# Patient Record
Sex: Female | Born: 1970 | Race: Black or African American | Hispanic: No | Marital: Married | Smoking: Never smoker
Health system: Southern US, Community
[De-identification: ages and names within clinical notes are randomized; demographics above are authoritative.]

## PROBLEM LIST (undated history)

## (undated) DIAGNOSIS — Z789 Other specified health status: Secondary | ICD-10-CM

## (undated) HISTORY — DX: Other specified health status: Z78.9

---

## 2005-07-15 ENCOUNTER — Ambulatory Visit: Payer: Self-pay | Admitting: Internal Medicine

## 2007-07-20 ENCOUNTER — Ambulatory Visit: Payer: Self-pay

## 2007-08-07 ENCOUNTER — Ambulatory Visit: Payer: Self-pay

## 2007-11-01 ENCOUNTER — Ambulatory Visit: Payer: Self-pay | Admitting: Internal Medicine

## 2008-01-22 ENCOUNTER — Ambulatory Visit: Payer: Self-pay | Admitting: Physician Assistant

## 2008-07-25 HISTORY — PX: FOOT SURGERY: SHX648

## 2008-09-17 ENCOUNTER — Ambulatory Visit: Payer: Self-pay | Admitting: Podiatry

## 2008-09-19 ENCOUNTER — Ambulatory Visit: Payer: Self-pay | Admitting: Podiatry

## 2008-11-06 ENCOUNTER — Ambulatory Visit: Payer: Self-pay | Admitting: Podiatry

## 2008-11-07 ENCOUNTER — Ambulatory Visit: Payer: Self-pay | Admitting: Podiatry

## 2009-07-25 HISTORY — PX: OVARIAN CYST SURGERY: SHX726

## 2009-07-25 HISTORY — PX: SALPINGECTOMY: SHX328

## 2009-07-25 HISTORY — PX: LAPAROSCOPY: SHX197

## 2010-07-25 NOTE — L&D Delivery Note (Signed)
Delivery Note Pt reached complete dilation and pushed well about 1 1/2 hours.  At 6:51 PM a healthy female was delivered via Vaginal, Spontaneous Delivery (Presentation: Left Occiput Anterior).  APGAR: 8, 9; weight 9 lb 2.9 oz (4165 g).   Placenta status: Intact, Spontaneous. Anesthesia: Epidural There was a moderate shoulder dystocia with the right arm anterior relieved with MacRoberts, posterior axillary lift, and suprapubic pressure. Pt with large fibroid uterus, but contracted down well after delivery. Episiotomy: None Lacerations: 1st degree;Perineal;Labial Suture Repair: 3.0 vicryl rapide Est. Blood Loss (mL): 450  Mom to postpartum.  Baby to nursery-stable.  Oliver Pila 03/25/2011, 7:28 PM

## 2010-09-06 LAB — GC/CHLAMYDIA PROBE AMP, GENITAL: Gonorrhea: NEGATIVE

## 2010-09-06 LAB — ABO/RH

## 2010-09-06 LAB — RPR: RPR: NONREACTIVE

## 2010-09-06 LAB — RUBELLA ANTIBODY, IGM: Rubella: IMMUNE

## 2010-12-24 ENCOUNTER — Inpatient Hospital Stay (HOSPITAL_COMMUNITY): Admission: AD | Admit: 2010-12-24 | Payer: Self-pay | Source: Ambulatory Visit | Admitting: Obstetrics and Gynecology

## 2011-02-24 LAB — STREP B DNA PROBE: GBS: NEGATIVE

## 2011-03-22 ENCOUNTER — Encounter (HOSPITAL_COMMUNITY): Payer: Self-pay | Admitting: *Deleted

## 2011-03-22 ENCOUNTER — Telehealth (HOSPITAL_COMMUNITY): Payer: Self-pay | Admitting: *Deleted

## 2011-03-22 NOTE — Telephone Encounter (Signed)
Preadmission screen  

## 2011-03-23 ENCOUNTER — Encounter (HOSPITAL_COMMUNITY): Payer: Self-pay | Admitting: *Deleted

## 2011-03-24 ENCOUNTER — Other Ambulatory Visit: Payer: Self-pay | Admitting: Obstetrics and Gynecology

## 2011-03-24 ENCOUNTER — Inpatient Hospital Stay (HOSPITAL_COMMUNITY)
Admission: RE | Admit: 2011-03-24 | Discharge: 2011-03-27 | DRG: 373 | Disposition: A | Discharge status: Home / Self Care | Payer: BC Managed Care – PPO | Source: Ambulatory Visit | Attending: Obstetrics and Gynecology | Admitting: Obstetrics and Gynecology

## 2011-03-24 ENCOUNTER — Encounter (HOSPITAL_COMMUNITY): Payer: Self-pay

## 2011-03-24 DIAGNOSIS — O34599 Maternal care for other abnormalities of gravid uterus, unspecified trimester: Secondary | ICD-10-CM | POA: Diagnosis present

## 2011-03-24 DIAGNOSIS — D4959 Neoplasm of unspecified behavior of other genitourinary organ: Secondary | ICD-10-CM | POA: Diagnosis present

## 2011-03-24 DIAGNOSIS — O480 Post-term pregnancy: Principal | ICD-10-CM | POA: Diagnosis present

## 2011-03-24 DIAGNOSIS — D259 Leiomyoma of uterus, unspecified: Secondary | ICD-10-CM | POA: Diagnosis present

## 2011-03-24 LAB — CBC
Hemoglobin: 12.7 g/dL (ref 12.0–15.0)
MCH: 28.6 pg (ref 26.0–34.0)
MCV: 81.3 fL (ref 78.0–100.0)
Platelets: 154 10*3/uL (ref 150–400)
RBC: 4.44 MIL/uL (ref 3.87–5.11)
WBC: 6.8 10*3/uL (ref 4.0–10.5)

## 2011-03-24 MED ORDER — TERBUTALINE SULFATE 1 MG/ML IJ SOLN: 0.2500 mg | Freq: Once | INTRAMUSCULAR | Status: AC | PRN

## 2011-03-24 MED ORDER — ONDANSETRON HCL 4 MG/2ML IJ SOLN
4.0000 mg | Freq: Four times a day (QID) | INTRAMUSCULAR | Status: DC | PRN
  Administered 2011-03-25: 4 mg via INTRAVENOUS
  Filled 2011-03-24: qty 2

## 2011-03-24 MED ORDER — LACTATED RINGERS IV SOLN: 500.0000 mL | INTRAVENOUS | Status: DC | PRN

## 2011-03-24 MED ORDER — DINOPROSTONE 10 MG VA INST
10.0000 mg | VAGINAL_INSERT | Freq: Once | VAGINAL | Status: AC
  Administered 2011-03-24: 10 mg via VAGINAL
  Filled 2011-03-24: qty 1

## 2011-03-24 MED ORDER — OXYTOCIN 20 UNITS IN LACTATED RINGERS INFUSION - SIMPLE: 125.0000 mL/h | Freq: Once | INTRAVENOUS | Status: DC

## 2011-03-24 MED ORDER — LACTATED RINGERS IV SOLN
INTRAVENOUS | Status: DC
  Administered 2011-03-24 – 2011-03-25 (×3): via INTRAVENOUS
  Administered 2011-03-25: 500 mL via INTRAVENOUS
  Administered 2011-03-25: 17:00:00 via INTRAVENOUS

## 2011-03-24 MED ORDER — IBUPROFEN 600 MG PO TABS: 600.0000 mg | ORAL_TABLET | Freq: Four times a day (QID) | ORAL | Status: DC | PRN

## 2011-03-24 MED ORDER — ACETAMINOPHEN 325 MG PO TABS: 650.0000 mg | ORAL_TABLET | ORAL | Status: DC | PRN

## 2011-03-24 MED ORDER — CITRIC ACID-SODIUM CITRATE 334-500 MG/5ML PO SOLN: 30.0000 mL | ORAL | Status: DC | PRN

## 2011-03-24 MED ORDER — LIDOCAINE HCL (PF) 1 % IJ SOLN
30.0000 mL | INTRAMUSCULAR | Status: DC | PRN
  Administered 2011-03-25: 30 mL via SUBCUTANEOUS
  Filled 2011-03-24: qty 30

## 2011-03-24 MED ORDER — OXYCODONE-ACETAMINOPHEN 5-325 MG PO TABS: 2.0000 | ORAL_TABLET | ORAL | Status: DC | PRN

## 2011-03-24 MED ORDER — OXYTOCIN BOLUS FROM INFUSION
500.0000 mL | Freq: Once | INTRAVENOUS | Status: DC
  Filled 2011-03-24: qty 500
  Filled 2011-03-24: qty 1000

## 2011-03-24 MED ORDER — OXYTOCIN 20 UNITS IN LACTATED RINGERS INFUSION - SIMPLE
1.0000 m[IU]/min | INTRAVENOUS | Status: DC
  Administered 2011-03-25: 10 m[IU]/min via INTRAVENOUS
  Administered 2011-03-25: 333 m[IU]/min via INTRAVENOUS
  Administered 2011-03-25: 2 m[IU]/min via INTRAVENOUS

## 2011-03-24 MED ORDER — FLEET ENEMA 7-19 GM/118ML RE ENEM: 1.0000 | ENEMA | RECTAL | Status: DC | PRN

## 2011-03-24 NOTE — H&P (Signed)
Ann Salas is an 40 y.o. female.G1P0 at 41+ weeks (EDD 03/16/11 by LMP c/w 12 week [place]) presents for induction of labor given postdates.  Prenatal care complicated by large fibroid uterus, 11cm and 8cm.  H/o borderline CHTN, but BP stable this pregnancy.  Pertinent Gynecological History: Fibroid Uterus  OB History: G1P0   Menstrual History: Patient's last menstrual period was 06/09/2010.    PMHx Borderline HTN  Past Surgical History  Procedure Date  . Foot surgery 2010  . Laparoscopy 2011  . Ovarian cyst surgery 2011  . Salpingectomy 2011    Family History  Problem Relation Age of Onset  . Hypertension Mother   . Diabetes Mother   . Hypertension Father   . Diabetes Maternal Grandmother     Social History:  reports that she has never smoked. She does not have any smokeless tobacco history on file. She reports that she does not drink alcohol or use illicit drugs.  Allergies: No Known Allergies    ROS  Last menstrual period 06/09/2010. Physical Exam  Constitutional: She appears well-developed.  Cardiovascular: Normal rate and regular rhythm.   Respiratory: Effort normal and breath sounds normal.  GI: Soft. Bowel sounds are normal.  Genitourinary: Vagina normal.       Cervix 50/1/-2   Prenatals Opos, Ab neg, Rub I, HepBsAg neg, HIV neg, RPR NR, GC neg, Chlam neg, GBS neg, First trimester screen WNL, AFP WNL, Sickle AA, One hour glucola 132    Assessment/Plan: Pt is coming in for a ripening and induction of labor.  Plan cervidil and then pitocin.  Oliver Pila 03/24/2011, 6:05 PM

## 2011-03-25 ENCOUNTER — Inpatient Hospital Stay (HOSPITAL_COMMUNITY): Payer: BC Managed Care – PPO | Admitting: Anesthesiology

## 2011-03-25 ENCOUNTER — Encounter (HOSPITAL_COMMUNITY): Payer: Self-pay | Admitting: Anesthesiology

## 2011-03-25 ENCOUNTER — Encounter (HOSPITAL_COMMUNITY): Payer: Self-pay

## 2011-03-25 MED ORDER — PRENATAL PLUS 27-1 MG PO TABS
1.0000 | ORAL_TABLET | Freq: Every day | ORAL | Status: DC
  Administered 2011-03-26 – 2011-03-27 (×2): 1 via ORAL
  Filled 2011-03-25 (×2): qty 1

## 2011-03-25 MED ORDER — LACTATED RINGERS IV SOLN: 500.0000 mL | Freq: Once | INTRAVENOUS | Status: DC

## 2011-03-25 MED ORDER — SIMETHICONE 80 MG PO CHEW: 80.0000 mg | CHEWABLE_TABLET | ORAL | Status: DC | PRN

## 2011-03-25 MED ORDER — LIDOCAINE HCL 1.5 % IJ SOLN
INTRAMUSCULAR | Status: DC | PRN
  Administered 2011-03-25 (×2): 5 mL via EPIDURAL

## 2011-03-25 MED ORDER — SENNOSIDES-DOCUSATE SODIUM 8.6-50 MG PO TABS
2.0000 | ORAL_TABLET | Freq: Every day | ORAL | Status: DC
  Administered 2011-03-25 – 2011-03-26 (×2): 2 via ORAL

## 2011-03-25 MED ORDER — DIPHENHYDRAMINE HCL 50 MG/ML IJ SOLN: 12.5000 mg | INTRAMUSCULAR | Status: DC | PRN

## 2011-03-25 MED ORDER — PHENYLEPHRINE 40 MCG/ML (10ML) SYRINGE FOR IV PUSH (FOR BLOOD PRESSURE SUPPORT)
80.0000 ug | PREFILLED_SYRINGE | INTRAVENOUS | Status: DC | PRN
  Filled 2011-03-25 (×2): qty 5

## 2011-03-25 MED ORDER — OXYCODONE-ACETAMINOPHEN 5-325 MG PO TABS: 1.0000 | ORAL_TABLET | ORAL | Status: DC | PRN

## 2011-03-25 MED ORDER — DIBUCAINE 1 % RE OINT: 1.0000 "application " | TOPICAL_OINTMENT | RECTAL | Status: DC | PRN

## 2011-03-25 MED ORDER — LANOLIN HYDROUS EX OINT: TOPICAL_OINTMENT | CUTANEOUS | Status: DC | PRN

## 2011-03-25 MED ORDER — ZOLPIDEM TARTRATE 5 MG PO TABS: 5.0000 mg | ORAL_TABLET | Freq: Every evening | ORAL | Status: DC | PRN

## 2011-03-25 MED ORDER — DIPHENHYDRAMINE HCL 25 MG PO CAPS: 25.0000 mg | ORAL_CAPSULE | Freq: Four times a day (QID) | ORAL | Status: DC | PRN

## 2011-03-25 MED ORDER — WITCH HAZEL-GLYCERIN EX PADS
1.0000 "application " | MEDICATED_PAD | CUTANEOUS | Status: DC | PRN
  Administered 2011-03-25: 1 via TOPICAL

## 2011-03-25 MED ORDER — TETANUS-DIPHTH-ACELL PERTUSSIS 5-2.5-18.5 LF-MCG/0.5 IM SUSP
0.5000 mL | Freq: Once | INTRAMUSCULAR | Status: AC
  Administered 2011-03-26: 0.5 mL via INTRAMUSCULAR
  Filled 2011-03-25: qty 0.5

## 2011-03-25 MED ORDER — BENZOCAINE-MENTHOL 20-0.5 % EX AERO: 1.0000 "application " | INHALATION_SPRAY | CUTANEOUS | Status: DC | PRN

## 2011-03-25 MED ORDER — PHENYLEPHRINE 40 MCG/ML (10ML) SYRINGE FOR IV PUSH (FOR BLOOD PRESSURE SUPPORT)
80.0000 ug | PREFILLED_SYRINGE | INTRAVENOUS | Status: DC | PRN
  Filled 2011-03-25: qty 5

## 2011-03-25 MED ORDER — EPHEDRINE 5 MG/ML INJ
10.0000 mg | INTRAVENOUS | Status: DC | PRN
  Filled 2011-03-25 (×2): qty 4

## 2011-03-25 MED ORDER — ONDANSETRON HCL 4 MG/2ML IJ SOLN: 4.0000 mg | INTRAMUSCULAR | Status: DC | PRN

## 2011-03-25 MED ORDER — BUTORPHANOL TARTRATE 2 MG/ML IJ SOLN
1.0000 mg | Freq: Once | INTRAMUSCULAR | Status: AC
  Administered 2011-03-25: 1 mg via INTRAVENOUS
  Filled 2011-03-25: qty 1

## 2011-03-25 MED ORDER — FENTANYL 2.5 MCG/ML BUPIVACAINE 1/10 % EPIDURAL INFUSION (WH - ANES)
14.0000 mL/h | INTRAMUSCULAR | Status: DC
  Administered 2011-03-25 (×2): 14 mL/h via EPIDURAL
  Filled 2011-03-25 (×2): qty 60

## 2011-03-25 MED ORDER — ONDANSETRON HCL 4 MG PO TABS: 4.0000 mg | ORAL_TABLET | ORAL | Status: DC | PRN

## 2011-03-25 MED ORDER — EPHEDRINE 5 MG/ML INJ
10.0000 mg | INTRAVENOUS | Status: DC | PRN
  Filled 2011-03-25: qty 4

## 2011-03-25 MED ORDER — PROMETHAZINE HCL 25 MG/ML IJ SOLN
12.5000 mg | Freq: Once | INTRAVENOUS | Status: DC
  Filled 2011-03-25: qty 0.5

## 2011-03-25 MED ORDER — IBUPROFEN 600 MG PO TABS
600.0000 mg | ORAL_TABLET | Freq: Four times a day (QID) | ORAL | Status: DC
  Administered 2011-03-25 – 2011-03-27 (×7): 600 mg via ORAL
  Filled 2011-03-25 (×7): qty 1

## 2011-03-25 NOTE — Anesthesia Procedure Notes (Signed)

## 2011-03-25 NOTE — Progress Notes (Signed)
Ester Gieske is a 40 y.o. G1P0 at [redacted]w[redacted]d  Subjective: Pt received epidural and is now uncomfortable with some pressure  Objective: BP 134/83  Pulse 76  Temp(Src) 97.9 F (36.6 C) (Oral)  Resp 18  Ht 5\' 6"  (1.676 m)  Wt 111.585 kg (246 lb)  BMI 39.71 kg/m2  SpO2 98%  LMP 06/09/2010      FHT:  FHR: 130 bpm, variability: moderate,  accelerations:  Present,  decelerations:  Absent UC:   regular, every 3 minutes SVE:   Dilation: 10 Effacement (%): 100 Station: +1;0 Exam by:: Dr Senaida Ores Pt pushing Labs: Lab Results  Component Value Date   WBC 6.8 03/24/2011   HGB 12.7 03/24/2011   HCT 36.1 03/24/2011   MCV 81.3 03/24/2011   PLT 154 03/24/2011    Assessment / Plan: Induction of labor due to postterm,  progressing well on pitocin  Labor: Progressing normally--Pt to push Epidural and FHR category 1 RICHARDSON,KATHY W 03/25/2011, 5:54 PM

## 2011-03-25 NOTE — Progress Notes (Signed)
Ann Salas is a 40 y.o. G1P0 at [redacted]w[redacted]d  Subjective: Pt increasingly uncomfortable with ctx  Objective: BP 126/65  Pulse 82  Temp(Src) 98 F (36.7 C) (Oral)  Resp 16  Ht 5\' 6"  (1.676 m)  Wt 111.585 kg (246 lb)  BMI 39.71 kg/m2  LMP 06/09/2010      FHT:  FHR: 140 bpm, variability: moderate,  accelerations:  Present,  decelerations:  Absent UC:   regular, every 2-3 minutes SVE:   Dilation: 2 Effacement (%): 90 Station: -1 Exam by:: Dr Senaida Ores  Labs: Lab Results  Component Value Date   WBC 6.8 03/24/2011   HGB 12.7 03/24/2011   HCT 36.1 03/24/2011   MCV 81.3 03/24/2011   PLT 154 03/24/2011    Assessment / Plan: Induction of labor due to postterm,  progressing well on pitocin  LRICHARDSON,KATHY W 03/25/2011, 11:41 AM

## 2011-03-25 NOTE — Progress Notes (Signed)
Ann Salas, RNC here to assist c shoulder dystocia

## 2011-03-25 NOTE — Progress Notes (Signed)
Rec'd report & assumed pt care.  Pt & family member both sleeping.  Will allow them to sleep until time for assessment.

## 2011-03-25 NOTE — Progress Notes (Signed)
Ann Salas is a 40 y.o. G1P0 at [redacted]Salas[redacted]d who came in last night for cervidil ripening and induction due to postdates.    Subjective: Pt had some cramping  Objective: BP 127/81  Pulse 79  Temp(Src) 98.2 F (36.8 C) (Oral)  Resp 18  Ht 5\' 6"  (1.676 m)  Wt 111.585 kg (246 lb)  BMI 39.71 kg/m2  LMP 06/09/2010      FHT:  FHR: 140 bpm, variability: moderate,  accelerations:  Present,  decelerations:  Absent UC:   irregular, every 3-4 minutes SVE:   Dilation: 1 Effacement (%): 70 Station: -1 Exam by:: Dr Ann Salas Cervidil pulled and AROM performed clear fluid noted. Labs: Lab Results  Component Value Date   WBC 6.8 03/24/2011   HGB 12.7 03/24/2011   HCT 36.1 03/24/2011   MCV 81.3 03/24/2011   PLT 154 03/24/2011    Assessment / Plan: Induction of labor due to postterm,  progressing well on pitocin  Labor: Good response to cervidil, will start pitocin Fetal Wellbeing:  Category I  Ann Salas 03/25/2011, 8:54 AM

## 2011-03-25 NOTE — Progress Notes (Signed)
Dr Jean Rosenthal here to address epidural questions/ concerns c pt & her husband. Left bedside for a few mins when finished with instructions to call him back when pt is positioned & ready

## 2011-03-25 NOTE — Progress Notes (Signed)
Report & pt care given to Armecia White, RN 

## 2011-03-25 NOTE — Anesthesia Preprocedure Evaluation (Addendum)
Anesthesia Evaluation  Name, MR# and DOB Patient awake  General Assessment Comment  Reviewed: Allergy & Precautions, H&P , Patient's Chart, lab work & pertinent test results  Airway Mallampati: II TM Distance: >3 FB Neck ROM: full    Dental No notable dental hx.    Pulmonary  clear to auscultation  pulmonary exam normalPulmonary Exam Normal breath sounds clear to auscultation none    Cardiovascular regular Normal    Neuro/Psych Negative Neurological ROS  Negative Psych ROS  GI/Hepatic/Renal negative GI ROS  negative Liver ROS  negative Renal ROS        Endo/Other  Negative Endocrine ROS (+)   Morbid obesity  Abdominal   Musculoskeletal   Hematology negative hematology ROS (+)   Peds  Reproductive/Obstetrics (+) Pregnancy    Anesthesia Other Findings             Anesthesia Physical Anesthesia Plan  ASA: III  Anesthesia Plan: Epidural   Post-op Pain Management:    Induction:   Airway Management Planned:   Additional Equipment:   Intra-op Plan:   Post-operative Plan:   Informed Consent: I have reviewed the patients History and Physical, chart, labs and discussed the procedure including the risks, benefits and alternatives for the proposed anesthesia with the patient or authorized representative who has indicated his/her understanding and acceptance.     Plan Discussed with:   Anesthesia Plan Comments:        Anesthesia Quick Evaluation  

## 2011-03-26 LAB — CBC
Hemoglobin: 10.1 g/dL — ABNORMAL LOW (ref 12.0–15.0)
MCH: 28.8 pg (ref 26.0–34.0)
Platelets: 155 10*3/uL (ref 150–400)
RBC: 3.51 MIL/uL — ABNORMAL LOW (ref 3.87–5.11)

## 2011-03-26 LAB — ABO/RH: ABO/RH(D): O POS

## 2011-03-26 NOTE — Progress Notes (Signed)
Post Partum Day 1 Subjective: no complaints and voiding  Objective: Blood pressure 123/75, pulse 90, temperature 98 F (36.7 C), temperature source Oral, resp. rate 20, height 5\' 6"  (1.676 m), weight 111.585 kg (246 lb), last menstrual period 06/09/2010, SpO2 98.00%, unknown if currently breastfeeding.  Physical Exam:  General: alert Lochia: appropriate Uterine Fundus: firm DVT Evaluation: No evidence of DVT seen on physical exam.   Basename 03/26/11 0542 03/24/11 2110  HGB 10.1* 12.7  HCT 28.8* 36.1    Assessment/Plan: Plan for discharge tomorrow   LOS: 2 days   RICHARDSON,KATHY W 03/26/2011, 10:11 AM

## 2011-03-27 MED ORDER — BENZOCAINE-MENTHOL 20-0.5 % EX AERO
INHALATION_SPRAY | CUTANEOUS | Status: AC
  Filled 2011-03-27: qty 56

## 2011-03-27 MED ORDER — IBUPROFEN 600 MG PO TABS: 600.0000 mg | ORAL_TABLET | Freq: Four times a day (QID) | ORAL | Status: AC

## 2011-03-27 MED ORDER — OXYCODONE-ACETAMINOPHEN 5-325 MG PO TABS: 1.0000 | ORAL_TABLET | ORAL | Status: AC | PRN

## 2011-03-27 NOTE — Progress Notes (Signed)
Post Partum Day 2 Subjective: no complaints, up ad lib and voiding  Objective: Blood pressure 111/69, pulse 85, temperature 98.5 F (36.9 C), temperature source Oral, resp. rate 18, height 5\' 6"  (1.676 m), weight 111.585 kg (246 lb), last menstrual period 06/09/2010, SpO2 99.00%, unknown if currently breastfeeding.  Physical Exam:  General: alert Lochia: appropriate Uterine Fundus: firm, enlarged above umbilicus with fibroids I Basename 03/26/11 0542 03/24/11 2110  HGB 10.1* 12.7  HCT 28.8* 36.1    Assessment/Plan: Discharge home Motrin, Percocet F/u 6 weeks   LOS: 3 days   Monchel Pollitt W 03/27/2011, 9:20 AM

## 2011-03-27 NOTE — Discharge Summary (Signed)
Obstetric Discharge Summary Reason for Admission: induction of labor for postdates Prenatal Procedures: cervidil ripening Intrapartum Procedures: spontaneous vaginal delivery Postpartum Procedures: none Complications-Operative and Postpartum: 1st degree perineal laceration and right labial Hemoglobin  Date Value Range Status  03/26/2011 10.1* 12.0-15.0 (g/dL) Final     DELTA CHECK NOTED     REPEATED TO VERIFY     HCT  Date Value Range Status  03/26/2011 28.8* 36.0-46.0 (%) Final    Discharge Diagnoses: Term Pregnancy-delivered                                         S/p NSVD                                         Fibroid Uterus  Discharge Information: Date: 03/27/2011 Activity: pelvic rest Diet: routine Medications: Ibuprophen and Percocet Condition: improved Instructions: refer to practice specific booklet Discharge to: home Follow-up Information    Follow up with Halston Kintz W. Make an appointment in 6 weeks.   Contact information:   [address] [place] [address] [phone]          Newborn Data: Live born female  Birth Weight: 9 lb 2.9 oz (4165 g) APGAR: 8, 9  Home with mother.  Oliver Pila 03/27/2011, 9:23 AM

## 2011-03-27 NOTE — Anesthesia Postprocedure Evaluation (Signed)
  Anesthesia Post Note  Patient: Ann Salas  Procedure(s) Performed: * No procedures listed *  Anesthesia type: Epidural  Patient location: Mother/Baby  Post pain: Pain level controlled  Post assessment: Post-op Vital signs reviewed  Last Vitals:  Filed Vitals:   03/27/11 0549  BP: 111/69  Pulse: 85  Temp: 98.5 F (36.9 C)  Resp: 18    Post vital signs: Reviewed  Level of consciousness: awake  Complications: No apparent anesthesia complications

## 2012-01-02 ENCOUNTER — Emergency Department: Payer: Self-pay | Admitting: Unknown Physician Specialty

## 2014-05-26 ENCOUNTER — Encounter (HOSPITAL_COMMUNITY): Payer: Self-pay

## 2017-06-29 DIAGNOSIS — H524 Presbyopia: Secondary | ICD-10-CM | POA: Diagnosis not present

## 2017-08-18 DIAGNOSIS — Z0000 Encounter for general adult medical examination without abnormal findings: Secondary | ICD-10-CM | POA: Diagnosis not present

## 2018-02-12 ENCOUNTER — Other Ambulatory Visit: Payer: Self-pay | Admitting: Family Medicine

## 2018-02-12 DIAGNOSIS — Z1231 Encounter for screening mammogram for malignant neoplasm of breast: Secondary | ICD-10-CM

## 2018-02-27 ENCOUNTER — Encounter (HOSPITAL_COMMUNITY): Payer: Self-pay

## 2018-02-27 ENCOUNTER — Ambulatory Visit
Admission: RE | Admit: 2018-02-27 | Discharge: 2018-02-27 | Disposition: A | Discharge status: Home / Self Care | Payer: 59 | Source: Ambulatory Visit | Attending: Family Medicine | Admitting: Family Medicine

## 2018-02-27 DIAGNOSIS — Z1231 Encounter for screening mammogram for malignant neoplasm of breast: Secondary | ICD-10-CM | POA: Insufficient documentation

## 2018-03-13 ENCOUNTER — Inpatient Hospital Stay
Admission: RE | Admit: 2018-03-13 | Discharge: 2018-03-13 | Disposition: A | Discharge status: Home / Self Care | Payer: Self-pay | Source: Ambulatory Visit | Attending: *Deleted | Admitting: *Deleted

## 2018-03-13 ENCOUNTER — Other Ambulatory Visit: Payer: Self-pay | Admitting: *Deleted

## 2018-03-13 DIAGNOSIS — Z9289 Personal history of other medical treatment: Secondary | ICD-10-CM

## 2018-08-13 DIAGNOSIS — H524 Presbyopia: Secondary | ICD-10-CM | POA: Diagnosis not present

## 2019-02-01 ENCOUNTER — Other Ambulatory Visit: Payer: Self-pay

## 2019-02-01 ENCOUNTER — Ambulatory Visit (LOCAL_COMMUNITY_HEALTH_CENTER): Payer: Self-pay

## 2019-02-01 DIAGNOSIS — Z111 Encounter for screening for respiratory tuberculosis: Secondary | ICD-10-CM

## 2019-02-01 LAB — READ PPD: TB Skin Test: NEGATIVE

## 2019-02-01 NOTE — Progress Notes (Signed)
Pt to clinic for PPDR. Per documentation provided by pt, PPD was placed by Cyril on 01/29/2019 in left. PPDR negative today at 0 mm, no indurations.

## 2020-01-20 ENCOUNTER — Other Ambulatory Visit: Payer: Self-pay | Admitting: Gerontology

## 2020-01-20 DIAGNOSIS — Z1231 Encounter for screening mammogram for malignant neoplasm of breast: Secondary | ICD-10-CM

## 2020-01-20 DIAGNOSIS — Z13220 Encounter for screening for lipoid disorders: Secondary | ICD-10-CM | POA: Diagnosis not present

## 2020-01-20 DIAGNOSIS — Z1329 Encounter for screening for other suspected endocrine disorder: Secondary | ICD-10-CM | POA: Diagnosis not present

## 2020-01-20 DIAGNOSIS — Z131 Encounter for screening for diabetes mellitus: Secondary | ICD-10-CM | POA: Diagnosis not present

## 2020-01-20 DIAGNOSIS — Z01419 Encounter for gynecological examination (general) (routine) without abnormal findings: Secondary | ICD-10-CM | POA: Diagnosis not present

## 2020-01-20 DIAGNOSIS — Z124 Encounter for screening for malignant neoplasm of cervix: Secondary | ICD-10-CM | POA: Diagnosis not present

## 2020-03-24 ENCOUNTER — Ambulatory Visit: Payer: Self-pay

## 2020-12-14 ENCOUNTER — Telehealth: Payer: 59 | Admitting: Nurse Practitioner

## 2020-12-14 DIAGNOSIS — R109 Unspecified abdominal pain: Secondary | ICD-10-CM

## 2020-12-14 DIAGNOSIS — H524 Presbyopia: Secondary | ICD-10-CM | POA: Diagnosis not present

## 2020-12-14 DIAGNOSIS — H52223 Regular astigmatism, bilateral: Secondary | ICD-10-CM | POA: Diagnosis not present

## 2020-12-14 DIAGNOSIS — Z135 Encounter for screening for eye and ear disorders: Secondary | ICD-10-CM | POA: Diagnosis not present

## 2020-12-14 DIAGNOSIS — H5213 Myopia, bilateral: Secondary | ICD-10-CM | POA: Diagnosis not present

## 2020-12-14 NOTE — Progress Notes (Signed)
We are sorry that you are not feeling well.  Here is how we plan to help!  Based on what you have shared with me I am concerned your pain may be related to your kidney. Single sided flank pain may be related to a kidney infection or a kidney stone.   This is something that is difficult to evaluate over an e visit.   Based on what you shared with me, I feel your condition warrants further evaluation and I recommend that you be seen in a face to face office visit.   NOTE: If you entered your credit card information for this eVisit, you will not be charged. You may see a "hold" on your card for the $35 but that hold will drop off and you will not have a charge processed.   If you are having a true medical emergency please call 911.      For an urgent face to face visit, [place] [facility] has six urgent care centers for your convenience:     [facility] [place] Get Driving Directions [phone] [place], [place] [address] . 8 am - 4 pm Monday - Friday    [place] [facility]) Get Driving Directions [phone] [address] [place], [place] [address] . 8 am to 8 pm Monday-Friday . 10 am to 6 pm [facility]) Get Driving Directions [phone]  [address] [place],  [place]  [address] . 8 am to 8 pm Monday-Friday . 8 am to 4 pm [facility]  Get Driving Directions [phone] [place], [place], [place] [address] . 8 am to 8 pm Monday-Friday . 8 am to 4 pm [facility] [place] Get Driving Directions  [phone] [address] [place], [place] [address] . 8 am to 8 pm Monday-Friday . 8 am to 4 pm [facility] [place] Get Driving Directions [phone] [address] [place], [place] [address] . 8 am to 8 pm Monday-Friday . 8 am to 4 pm Saturday-Sunday     Your MyChart E-visit questionnaire answers were reviewed by a board certified advanced clinical practitioner to complete your personal care plan based on your specific symptoms.  Thank you for using e-Visits.

## 2020-12-14 NOTE — Progress Notes (Signed)
Patient referred to [facility] for acute flank pain

## 2020-12-25 DIAGNOSIS — Z20822 Contact with and (suspected) exposure to covid-19: Secondary | ICD-10-CM | POA: Diagnosis not present

## 2021-02-03 ENCOUNTER — Other Ambulatory Visit: Payer: Self-pay | Admitting: Family Medicine

## 2021-02-03 DIAGNOSIS — Z1231 Encounter for screening mammogram for malignant neoplasm of breast: Secondary | ICD-10-CM

## 2021-03-02 ENCOUNTER — Other Ambulatory Visit: Payer: Self-pay

## 2021-03-02 ENCOUNTER — Ambulatory Visit
Admission: RE | Admit: 2021-03-02 | Discharge: 2021-03-02 | Disposition: A | Discharge status: Home / Self Care | Payer: 59 | Source: Ambulatory Visit | Attending: Family Medicine | Admitting: Family Medicine

## 2021-03-02 DIAGNOSIS — Z1231 Encounter for screening mammogram for malignant neoplasm of breast: Secondary | ICD-10-CM | POA: Diagnosis not present

## 2021-03-08 ENCOUNTER — Other Ambulatory Visit: Payer: Self-pay | Admitting: Family Medicine

## 2021-03-08 DIAGNOSIS — R928 Other abnormal and inconclusive findings on diagnostic imaging of breast: Secondary | ICD-10-CM

## 2021-03-08 DIAGNOSIS — N6320 Unspecified lump in the left breast, unspecified quadrant: Secondary | ICD-10-CM

## 2021-03-09 ENCOUNTER — Other Ambulatory Visit: Payer: Self-pay

## 2021-03-09 ENCOUNTER — Ambulatory Visit
Admission: RE | Admit: 2021-03-09 | Discharge: 2021-03-09 | Disposition: A | Discharge status: Home / Self Care | Payer: 59 | Source: Ambulatory Visit | Attending: Family Medicine | Admitting: Family Medicine

## 2021-03-09 DIAGNOSIS — R928 Other abnormal and inconclusive findings on diagnostic imaging of breast: Secondary | ICD-10-CM

## 2021-03-09 DIAGNOSIS — N6320 Unspecified lump in the left breast, unspecified quadrant: Secondary | ICD-10-CM

## 2021-03-09 DIAGNOSIS — R922 Inconclusive mammogram: Secondary | ICD-10-CM | POA: Diagnosis not present

## 2021-03-25 DEATH — deceased

## 2021-04-13 DIAGNOSIS — Z0000 Encounter for general adult medical examination without abnormal findings: Secondary | ICD-10-CM | POA: Diagnosis not present

## 2021-04-13 DIAGNOSIS — Z13220 Encounter for screening for lipoid disorders: Secondary | ICD-10-CM | POA: Diagnosis not present

## 2021-10-12 DIAGNOSIS — D649 Anemia, unspecified: Secondary | ICD-10-CM | POA: Diagnosis not present

## 2021-10-12 DIAGNOSIS — Z0000 Encounter for general adult medical examination without abnormal findings: Secondary | ICD-10-CM | POA: Diagnosis not present

## 2023-01-06 IMAGING — MG MM DIGITAL DIAGNOSTIC UNILAT*L* W/ TOMO W/ CAD
4 series · 4 of 12 positions shown · non-contrast
Comparison: Previous exam(s).

CLINICAL DATA: Screening recall for a possible left breast mass.

EXAM:
DIGITAL DIAGNOSTIC UNILATERAL LEFT MAMMOGRAM WITH TOMOSYNTHESIS AND
CAD; ULTRASOUND LEFT BREAST LIMITED
TECHNIQUE: Left digital diagnostic mammography and breast tomosynthesis was
performed. The images were evaluated with computer-aided detection.;
Targeted ultrasound examination of the left breast was performed.

[L MLO synth-2D]
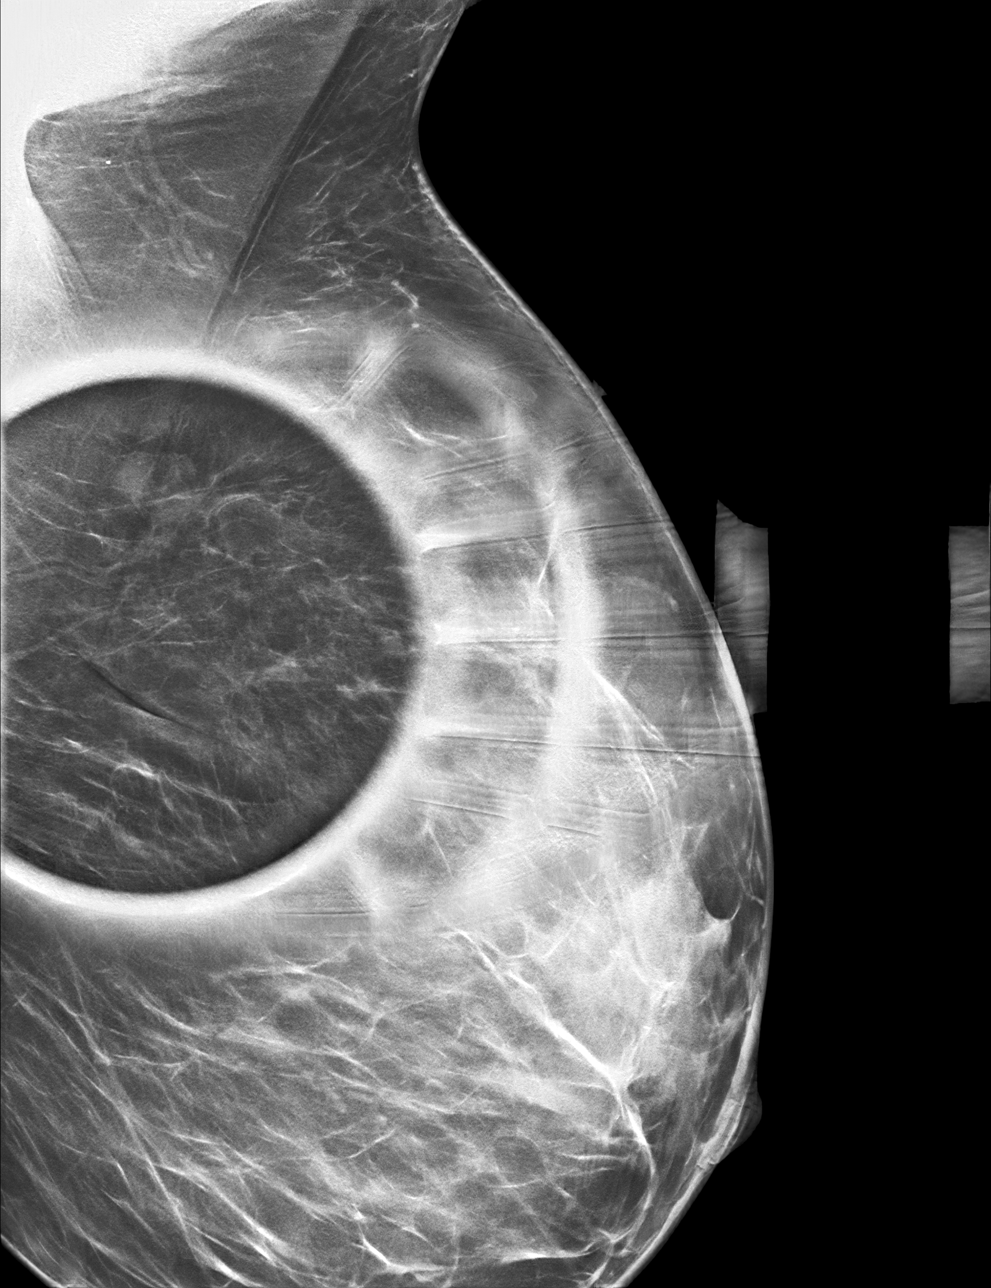

[L CC synth-2D]
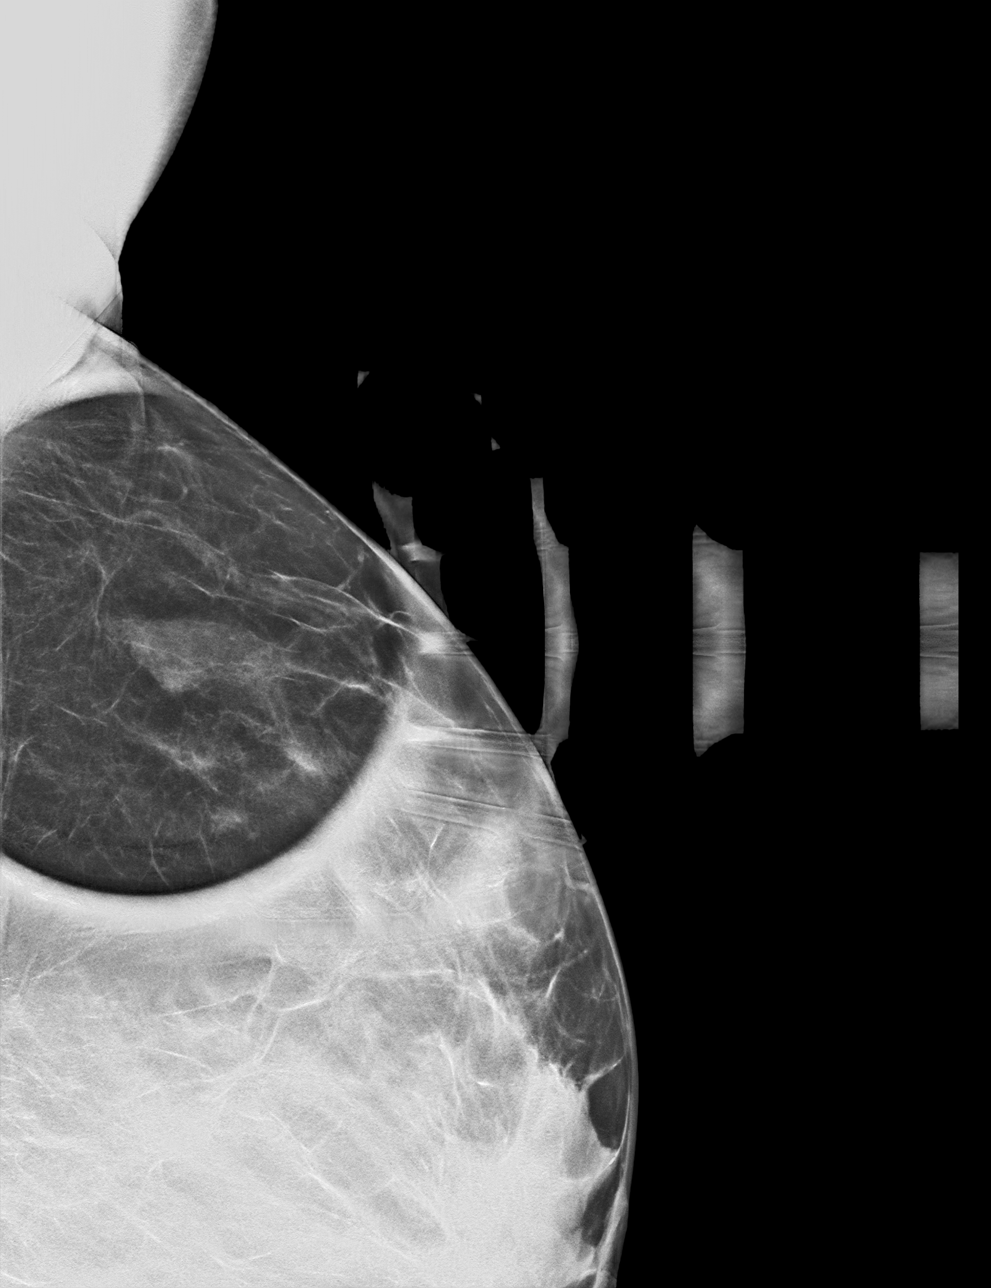

[L MLO tomo · tomo slice 36/71.0]
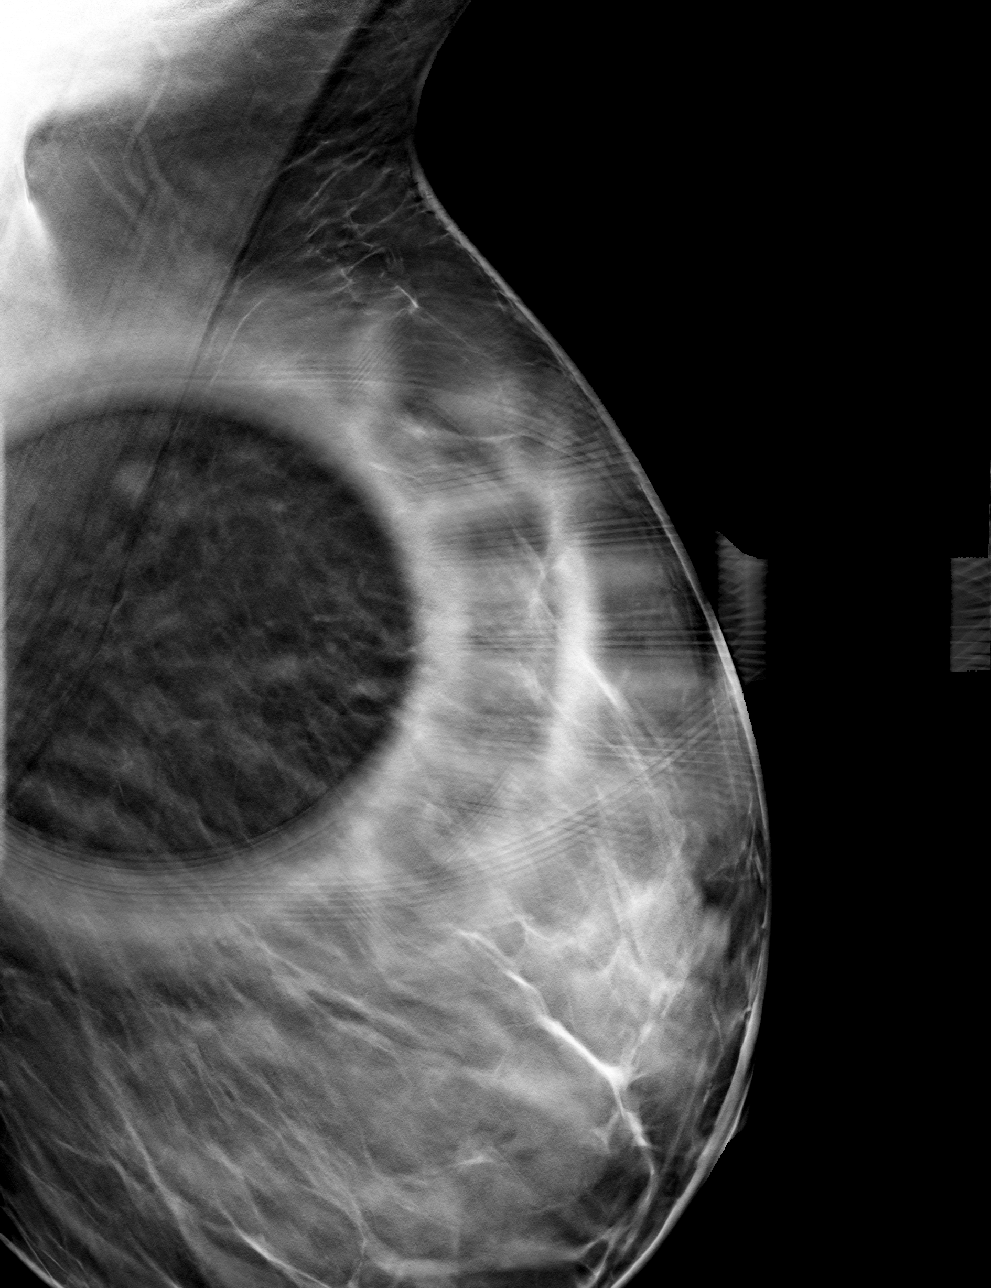

[L CC tomo · tomo slice 29/56.0]
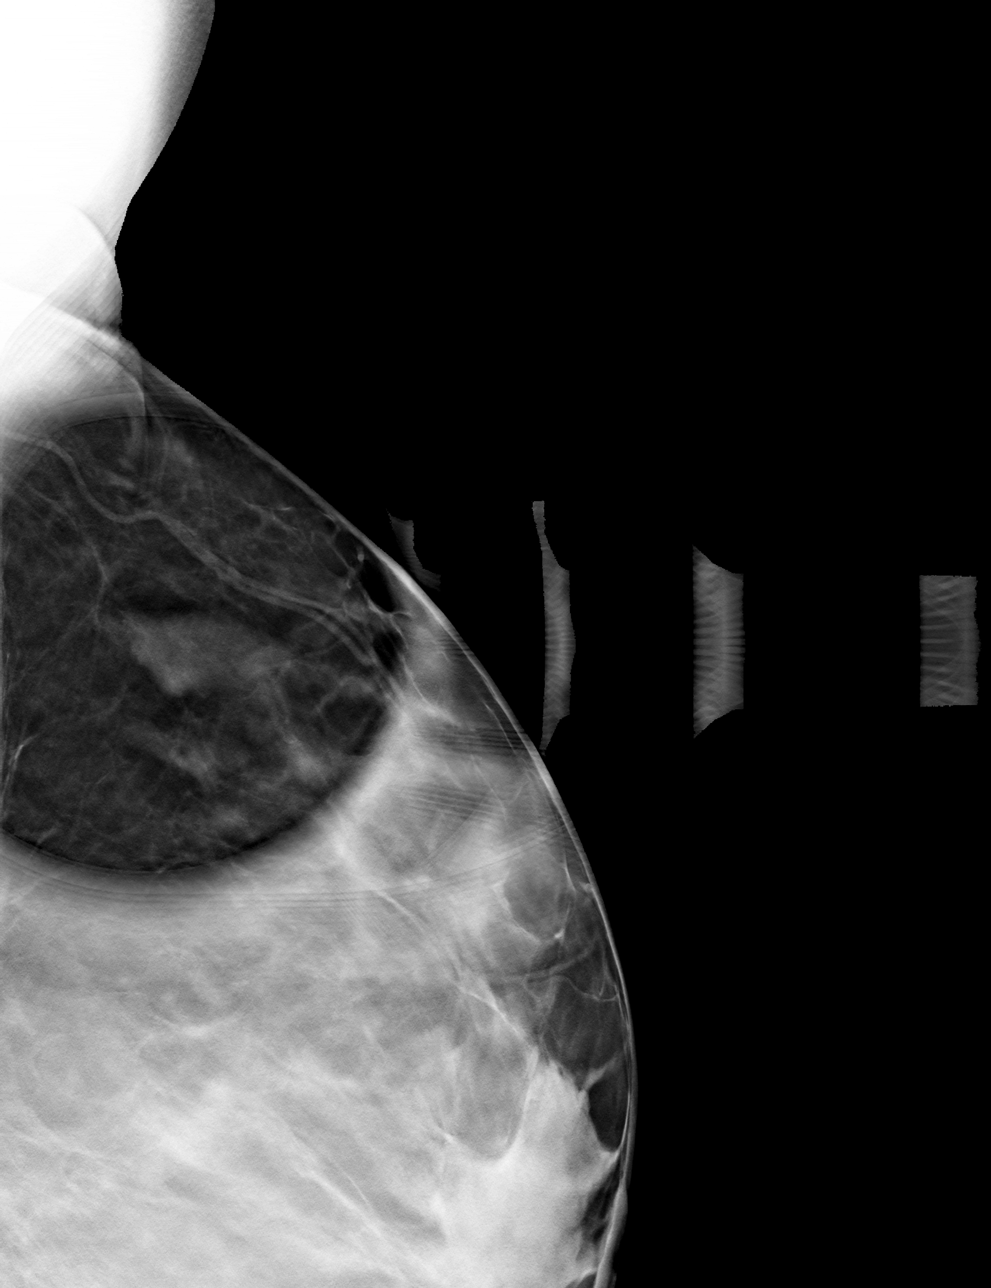

[4 of 12 positions shown; findings below may reference images not displayed]

ACR Breast Density Category c: The breast tissue is heterogeneously
dense, which may obscure small masses.
FINDINGS: Spot compression tomosynthesis images reveal a persistent oval
circumscribed low-density lobulated mass in the upper-outer
quadrant/low axilla. This measures approximately 1.4 cm
mammographically.

Ultrasound targeted to the left breast at 2 o'clock, 12 cm from the
nipple demonstrates an anechoic oval circumscribed septated mass
measuring 1.5 x 0.6 x 1.0 cm.
IMPRESSION: The left breast mass at 2 o'clock corresponds with a benign cyst.

RECOMMENDATION:
Screening mammogram in one year.(Code:[id number]-[id number])

I have discussed the findings and recommendations with the patient.
If applicable, a reminder letter will be sent to the patient
regarding the next appointment.

BI-RADS CATEGORY  2: Benign.
